# Patient Record
Sex: Male | Born: 1995 | Race: Black or African American | Hispanic: No | Marital: Single | Smoking: Never smoker
Health system: Southern US, Community
[De-identification: ages and names within clinical notes are randomized; demographics above are authoritative.]

---

## 2017-05-21 ENCOUNTER — Encounter (HOSPITAL_COMMUNITY): Payer: Self-pay | Admitting: Emergency Medicine

## 2017-05-21 ENCOUNTER — Other Ambulatory Visit: Payer: Self-pay

## 2017-05-21 ENCOUNTER — Ambulatory Visit (HOSPITAL_COMMUNITY)
Admission: EM | Admit: 2017-05-21 | Discharge: 2017-05-21 | Disposition: A | Discharge status: Home / Self Care | Payer: 59 | Attending: Internal Medicine | Admitting: Internal Medicine

## 2017-05-21 DIAGNOSIS — J0140 Acute pansinusitis, unspecified: Secondary | ICD-10-CM

## 2017-05-21 DIAGNOSIS — J309 Allergic rhinitis, unspecified: Secondary | ICD-10-CM

## 2017-05-21 MED ORDER — TRIAMCINOLONE ACETONIDE 55 MCG/ACT NA AERO: 2.0000 | INHALATION_SPRAY | Freq: Every day | NASAL | 12 refills | Status: DC

## 2017-05-21 MED ORDER — DOXYCYCLINE HYCLATE 100 MG PO CAPS: 100.0000 mg | ORAL_CAPSULE | Freq: Two times a day (BID) | ORAL | 0 refills | Status: DC

## 2017-05-21 MED ORDER — BENZONATATE 100 MG PO CAPS: 100.0000 mg | ORAL_CAPSULE | Freq: Three times a day (TID) | ORAL | 0 refills | Status: DC

## 2017-05-21 MED ORDER — CETIRIZINE HCL 10 MG PO TABS: 10.0000 mg | ORAL_TABLET | Freq: Every day | ORAL | 0 refills | Status: DC

## 2017-05-21 NOTE — ED Triage Notes (Signed)
Coughing for 2 days, patient also has sinus pressure.

## 2017-05-21 NOTE — Discharge Instructions (Signed)
Tessalon for cough. Start nasacort and zyrtec for sinus pressure/possible allergies. You can use over the counter nasal saline rinse such as neti pot for nasal congestion. Keep hydrated, your urine should be clear to pale yellow in color. Tylenol/motrin for fever and pain. Monitor for any worsening of symptoms, chest pain, shortness of breath, wheezing, swelling of the throat, follow up for reevaluation.   Can start doxycycline in 3 days if symptoms not improving

## 2017-05-21 NOTE — ED Provider Notes (Signed)
[facility]    CSN: [id number] Arrival date & time: 05/21/17  1714     History   Chief Complaint Chief Complaint  Patient presents with  . URI    HPI Roberto Rodriguez is a 22 y.o. male.   22 year old male comes in for 7-day history of URI symptoms.  Has had productive cough, sore throat, sinus pressure.  No obvious nasal congestion, rhinorrhea.  Take some allergy medicine, OTC cold medicine without relief.  States he initially thought this could be allergies, but given positive sick contact, came in for evaluation.  Never tobacco smoker.  Has used THC in the past.     History reviewed. No pertinent past medical history.  There are no active problems to display for this patient.   History reviewed. No pertinent surgical history.     Home Medications    Prior to Admission medications   Medication Sig Start Date End Date Taking? Authorizing Provider  benzonatate (TESSALON) 100 MG capsule Take 1 capsule (100 mg total) by mouth every 8 (eight) hours. 05/21/17   Cathie HoopsYu, Amy V, PA-C  cetirizine (ZYRTEC) 10 MG tablet Take 1 tablet (10 mg total) by mouth daily. 05/21/17   Cathie HoopsYu, Amy V, PA-C  doxycycline (VIBRAMYCIN) 100 MG capsule Take 1 capsule (100 mg total) by mouth 2 (two) times daily. 05/21/17   Cathie HoopsYu, Amy V, PA-C  triamcinolone (NASACORT) 55 MCG/ACT AERO nasal inhaler Place 2 sprays into the nose daily. 05/21/17   Belinda FisherYu, Amy V, PA-C    Family History Family History  Problem Relation Age of Onset  . Asthma Mother     Social History Social History   Tobacco Use  . Smoking status: Never Smoker  Substance Use Topics  . Alcohol use: Yes  . Drug use: Never     Allergies   Patient has no known allergies.   Review of Systems Review of Systems  Reason unable to perform ROS: See HPI as above.     Physical Exam Triage Vital Signs ED Triage Vitals  Enc Vitals Group     BP 05/21/17 1807 131/73     Pulse Rate 05/21/17 1807 74     Resp 05/21/17 1807 18     Temp  05/21/17 1807 99.2 F (37.3 C)     Temp Source 05/21/17 1807 Oral     SpO2 05/21/17 1807 99 %     Weight --      Height --      Head Circumference --      Peak Flow --      Pain Score 05/21/17 1805 5     Pain Loc --      Pain Edu? --      Excl. in [place]? --    No data found.  Updated Vital Signs BP 131/73 (BP Location: Left Arm)   Pulse 74   Temp 99.2 F (37.3 C) (Oral)   Resp 18   SpO2 99%   Physical Exam  Constitutional: He is oriented to person, place, and time. He appears well-developed and well-nourished. No distress.  HENT:  Head: Normocephalic and atraumatic.  Right Ear: External ear and ear canal normal.  Left Ear: External ear and ear canal normal. Tympanic membrane is erythematous. Tympanic membrane is not bulging.  Nose: Mucosal edema present. Right sinus exhibits no maxillary sinus tenderness and no frontal sinus tenderness. Left sinus exhibits no maxillary sinus tenderness and no frontal sinus tenderness.  Mouth/Throat: Uvula is midline, oropharynx is clear and  moist and mucous membranes are normal.  Right ear with cerumen impaction, TM not visible.  Eyes: Pupils are equal, round, and reactive to light. Conjunctivae are normal.  Neck: Normal range of motion. Neck supple.  Cardiovascular: Normal rate, regular rhythm and normal heart sounds. Exam reveals no gallop and no friction rub.  No murmur heard. Pulmonary/Chest: Effort normal and breath sounds normal. He has no decreased breath sounds. He has no wheezes. He has no rhonchi. He has no rales.  Lymphadenopathy:    He has no cervical adenopathy.  Neurological: He is alert and oriented to person, place, and time.  Skin: Skin is warm and dry. He is not diaphoretic.  Psychiatric: He has a normal mood and affect. His behavior is normal. Judgment normal.     UC Treatments / Results  Labs (all labs ordered are listed, but only abnormal results are displayed) Labs Reviewed - No data to  display  EKG None Radiology No results found.  Procedures Procedures (including critical care time)  Medications Ordered in UC Medications - No data to display   Initial Impression / Assessment and Plan / UC Course  I have reviewed the triage vital signs and the nursing notes.  Pertinent labs & imaging results that were available during my care of the patient were reviewed by me and considered in my medical decision making (see chart for details).    Discussed with patient history and exam most consistent with allergic rhinitis vs viral URI. Symptomatic treatment as needed. Rx of doxycycline sent to pharmacy, patient can fill to take if symptoms not improving in 3-5 days. Push fluids. Return precautions given.    Final Clinical Impressions(s) / UC Diagnoses   Final diagnoses:  Acute non-recurrent pansinusitis  Allergic rhinitis, unspecified seasonality, unspecified trigger    ED Discharge Orders        Ordered    cetirizine (ZYRTEC) 10 MG tablet  Daily     05/21/17 1825    triamcinolone (NASACORT) 55 MCG/ACT AERO nasal inhaler  Daily     05/21/17 1825    benzonatate (TESSALON) 100 MG capsule  Every 8 hours     05/21/17 1825    doxycycline (VIBRAMYCIN) 100 MG capsule  2 times daily     05/21/17 1825        Belinda Fisher, PA-C 05/21/17 1835

## 2018-09-24 ENCOUNTER — Encounter (HOSPITAL_COMMUNITY): Payer: Self-pay

## 2018-09-24 ENCOUNTER — Other Ambulatory Visit: Payer: Self-pay

## 2018-09-24 ENCOUNTER — Ambulatory Visit (INDEPENDENT_AMBULATORY_CARE_PROVIDER_SITE_OTHER): Payer: 59

## 2018-09-24 ENCOUNTER — Ambulatory Visit (HOSPITAL_COMMUNITY)
Admission: EM | Admit: 2018-09-24 | Discharge: 2018-09-24 | Disposition: A | Discharge status: Home / Self Care | Payer: 59 | Attending: Family Medicine | Admitting: Family Medicine

## 2018-09-24 DIAGNOSIS — Z20828 Contact with and (suspected) exposure to other viral communicable diseases: Secondary | ICD-10-CM | POA: Insufficient documentation

## 2018-09-24 DIAGNOSIS — R109 Unspecified abdominal pain: Secondary | ICD-10-CM | POA: Diagnosis not present

## 2018-09-24 LAB — COMPREHENSIVE METABOLIC PANEL
ALT: 19 U/L (ref 0–44)
AST: 20 U/L (ref 15–41)
Albumin: 4.7 g/dL (ref 3.5–5.0)
Alkaline Phosphatase: 43 U/L (ref 38–126)
Anion gap: 10 (ref 5–15)
BUN: <5 mg/dL — ABNORMAL LOW (ref 6–20)
CO2: 25 mmol/L (ref 22–32)
Calcium: 9.8 mg/dL (ref 8.9–10.3)
Chloride: 104 mmol/L (ref 98–111)
Creatinine, Ser: 0.85 mg/dL (ref 0.61–1.24)
GFR calc Af Amer: >60 mL/min (ref >60)
GFR calc non Af Amer: >60 mL/min (ref >60)
Glucose, Bld: 99 mg/dL (ref 70–99)
Potassium: 4.3 mmol/L (ref 3.5–5.1)
Sodium: 139 mmol/L (ref 135–145)
Total Bilirubin: 1.3 mg/dL — ABNORMAL HIGH (ref 0.3–1.2)
Total Protein: 7.3 g/dL (ref 6.5–8.1)

## 2018-09-24 LAB — CBC WITH DIFFERENTIAL/PLATELET
Abs Immature Granulocytes: 0.02 10*3/uL (ref 0.00–0.07)
Basophils Absolute: 0.1 10*3/uL (ref 0.0–0.1)
Basophils Relative: 1 %
Eosinophils Absolute: 0.2 10*3/uL (ref 0.0–0.5)
Eosinophils Relative: 3 %
HCT: 46.2 % (ref 39.0–52.0)
Hemoglobin: 16 g/dL (ref 13.0–17.0)
Immature Granulocytes: 0 %
Lymphocytes Relative: 21 %
Lymphs Abs: 1.5 10*3/uL (ref 0.7–4.0)
MCH: 31.4 pg (ref 26.0–34.0)
MCHC: 34.6 g/dL (ref 30.0–36.0)
MCV: 90.6 fL (ref 80.0–100.0)
Monocytes Absolute: 0.5 10*3/uL (ref 0.1–1.0)
Monocytes Relative: 7 %
Neutro Abs: 4.9 10*3/uL (ref 1.7–7.7)
Neutrophils Relative %: 68 %
Platelets: 213 10*3/uL (ref 150–400)
RBC: 5.1 MIL/uL (ref 4.22–5.81)
RDW: 12.8 % (ref 11.5–15.5)
WBC: 7.2 10*3/uL (ref 4.0–10.5)
nRBC: 0 % (ref 0.0–0.2)

## 2018-09-24 LAB — POCT URINALYSIS DIP (DEVICE)
Bilirubin Urine: NEGATIVE
Glucose, UA: NEGATIVE mg/dL
Hgb urine dipstick: NEGATIVE
Ketones, ur: NEGATIVE mg/dL
Leukocytes,Ua: NEGATIVE
Nitrite: NEGATIVE
Protein, ur: NEGATIVE mg/dL
Specific Gravity, Urine: 1.015 (ref 1.005–1.030)
Urobilinogen, UA: 0.2 mg/dL (ref 0.0–1.0)
pH: 7 (ref 5.0–8.0)

## 2018-09-24 LAB — LIPASE, BLOOD: Lipase: 20 U/L (ref 11–51)

## 2018-09-24 NOTE — ED Triage Notes (Signed)
Pt presents to UC with left flank pain since 2 days. Pt reports vomiting profusely 2 nights ago and chills. Pain came after the vomiting. Pt reports stool turning white 2 nights ago. Denies diarrhea.

## 2018-09-24 NOTE — ED Triage Notes (Signed)
Pt requests STD testing at this time.

## 2018-09-24 NOTE — Discharge Instructions (Signed)
Your urine did not show any infection or blood. Your x-ray was normal 1 of your blood test came back and this was also normal.  I am still awaiting the rest of your blood results. We have tested you for HIV, syphilis, gonorrhea, chlamydia and trichomonas. Also checking your liver and kidneys. I would recommend if your symptoms continue or worsen to include more severe pain, nausea, vomiting or any other concerning symptoms you need to go to the ER.

## 2018-09-25 LAB — RPR: RPR Ser Ql: NONREACTIVE

## 2018-09-25 LAB — HIV ANTIBODY (ROUTINE TESTING W REFLEX): HIV Screen 4th Generation wRfx: NONREACTIVE

## 2018-09-25 LAB — NOVEL CORONAVIRUS, NAA (HOSP ORDER, SEND-OUT TO REF LAB; TAT 18-24 HRS): SARS-CoV-2, NAA: NOT DETECTED

## 2018-09-27 ENCOUNTER — Encounter (HOSPITAL_COMMUNITY): Payer: Self-pay

## 2018-09-27 NOTE — ED Provider Notes (Signed)
[place]    CSN: [id number] Arrival date & time: 09/24/18  1339      History   Chief Complaint Chief Complaint  Patient presents with  . Flank Pain    HPI Roberto Rodriguez is a 23 y.o. male.   Patient is a 23 year old male who presents today with left flank pain x2 days and to left upper abdominal area..  Symptoms have been waxing and waning.  Reporting that he vomited profusely 2 nights ago and had chills.  That has since resolved.  Reporting a white stool approximate 2 days ago.  Since he has had regular bowel movements without hematochezia.  No diarrhea.  No fever.  He is also reporting prior to starting change in diet.  Patient is typically a vegetarian and ate some fried seafood.  Patient also admits to drinking heavily a few days a week.  No dysuria, hematuria or urinary frequency.  No penile discharge, penile swelling, testicle pain or swelling.  Patient requesting STD testing.  ROS per HPI      History reviewed. No pertinent past medical history.  There are no active problems to display for this patient.   History reviewed. No pertinent surgical history.     Home Medications    Prior to Admission medications   Medication Sig Start Date End Date Taking? Authorizing Provider  cetirizine (ZYRTEC) 10 MG tablet Take 1 tablet (10 mg total) by mouth daily. 05/21/17 09/24/18  Tasia Catchings, Amy V, PA-C  triamcinolone (NASACORT) 55 MCG/ACT AERO nasal inhaler Place 2 sprays into the nose daily. 05/21/17 09/24/18  Ok Edwards, PA-C    Family History Family History  Problem Relation Age of Onset  . Asthma Mother   . Healthy Father     Social History Social History   Tobacco Use  . Smoking status: Never Smoker  . Smokeless tobacco: Never Used  Substance Use Topics  . Alcohol use: Yes    Comment: 3-4 times a week  . Drug use: Yes    Types: Marijuana    Comment: 4 times a week     Allergies   Patient has no known allergies.   Review of Systems Review of  Systems   Physical Exam Triage Vital Signs ED Triage Vitals  Enc Vitals Group     BP 09/24/18 1404 (!) 151/78     Pulse Rate 09/24/18 1404 93     Resp 09/24/18 1404 16     Temp 09/24/18 1404 98.8 F (37.1 C)     Temp Source 09/24/18 1404 Oral     SpO2 09/24/18 1404 100 %     Weight --      Height --      Head Circumference --      Peak Flow --      Pain Score 09/24/18 1407 6     Pain Loc --      Pain Edu? --      Excl. in [place]? --    No data found.  Updated Vital Signs BP (!) 151/78   Pulse 93   Temp 98.8 F (37.1 C) (Oral)   Resp 16   SpO2 100%   Visual Acuity Right Eye Distance:   Left Eye Distance:   Bilateral Distance:    Right Eye Near:   Left Eye Near:    Bilateral Near:     Physical Exam Vitals signs and nursing note reviewed.  Constitutional:      General: He is not in acute  distress.    Appearance: Normal appearance. He is not ill-appearing, toxic-appearing or diaphoretic.  HENT:     Head: Normocephalic and atraumatic.     Nose: Nose normal.     Mouth/Throat:     Pharynx: Oropharynx is clear.  Eyes:     Conjunctiva/sclera: Conjunctivae normal.  Neck:     Musculoskeletal: Normal range of motion.  Abdominal:     General: There is no distension.     Palpations: Abdomen is soft. There is no mass.     Tenderness: There is no abdominal tenderness. There is no guarding or rebound.     Hernia: No hernia is present.     Comments: Mild tenderness to palpation of left flank and upper abdominal area.   Musculoskeletal: Normal range of motion.  Skin:    General: Skin is warm and dry.  Neurological:     Mental Status: He is alert.  Psychiatric:        Mood and Affect: Mood normal.      UC Treatments / Results  Labs (all labs ordered are listed, but only abnormal results are displayed) Labs Reviewed  COMPREHENSIVE METABOLIC PANEL - Abnormal; Notable for the following components:      Result Value   BUN <5 (*)    Total Bilirubin 1.3 (*)    All  other components within normal limits  NOVEL CORONAVIRUS, NAA (HOSP ORDER, SEND-OUT TO REF LAB; TAT 18-24 HRS)  CBC WITH DIFFERENTIAL/PLATELET  LIPASE, BLOOD  HIV ANTIBODY (ROUTINE TESTING W REFLEX)  RPR  POCT URINALYSIS DIP (DEVICE)  CYTOLOGY, (ORAL, ANAL, URETHRAL) ANCILLARY ONLY    EKG   Radiology No results found.  Procedures Procedures (including critical care time)  Medications Ordered in UC Medications - No data to display  Initial Impression / Assessment and Plan / UC Course  I have reviewed the triage vital signs and the nursing notes.  Pertinent labs & imaging results that were available during my care of the patient were reviewed by me and considered in my medical decision making (see chart for details).     23 year old male with approximately 2 days of waxing and waning left flank and upper abdominal pain. Urine without infection or hematuria. X-ray without any severe constipation or obstruction. All lab work completely normal Most likely symptoms are related to the change in diet or some sort of gastrointestinal virus. We will have him monitor his symptoms constant fluids and advance diet as tolerated. If symptoms worsen to include more severe abdominal pain, back pain or other concerning symptoms he went to go to the ER. Pt understanding and agreed.   STD labs still pending.    Final Clinical Impressions(s) / UC Diagnoses   Final diagnoses:  Flank pain     Discharge Instructions     Your urine did not show any infection or blood. Your x-ray was normal 1 of your blood test came back and this was also normal.  I am still awaiting the rest of your blood results. We have tested you for HIV, syphilis, gonorrhea, chlamydia and trichomonas. Also checking your liver and kidneys. I would recommend if your symptoms continue or worsen to include more severe pain, nausea, vomiting or any other concerning symptoms you need to go to the ER.    ED  Prescriptions    None     Controlled Substance Prescriptions [place] Controlled Substance Registry consulted? Not Applicable   Janace ArisBast, Doris Gruhn A, NP 09/27/18 [phone]

## 2018-09-29 LAB — CYTOLOGY, (ORAL, ANAL, URETHRAL) ANCILLARY ONLY
Chlamydia: NEGATIVE
Neisseria Gonorrhea: NEGATIVE
Trichomonas: NEGATIVE

## 2018-10-25 ENCOUNTER — Other Ambulatory Visit: Payer: Self-pay

## 2018-10-25 DIAGNOSIS — Z20822 Contact with and (suspected) exposure to covid-19: Secondary | ICD-10-CM

## 2018-10-26 LAB — NOVEL CORONAVIRUS, NAA: SARS-CoV-2, NAA: NOT DETECTED

## 2018-11-25 ENCOUNTER — Emergency Department (HOSPITAL_COMMUNITY): Payer: 59

## 2018-11-25 ENCOUNTER — Encounter (HOSPITAL_COMMUNITY): Payer: Self-pay | Admitting: Emergency Medicine

## 2018-11-25 ENCOUNTER — Other Ambulatory Visit: Payer: Self-pay

## 2018-11-25 ENCOUNTER — Emergency Department (HOSPITAL_COMMUNITY)
Admission: EM | Admit: 2018-11-25 | Discharge: 2018-11-25 | Disposition: A | Discharge status: Home / Self Care | Payer: 59 | Attending: Emergency Medicine | Admitting: Emergency Medicine

## 2018-11-25 DIAGNOSIS — Y92410 Unspecified street and highway as the place of occurrence of the external cause: Secondary | ICD-10-CM | POA: Insufficient documentation

## 2018-11-25 DIAGNOSIS — S5011XA Contusion of right forearm, initial encounter: Secondary | ICD-10-CM

## 2018-11-25 DIAGNOSIS — Y999 Unspecified external cause status: Secondary | ICD-10-CM | POA: Diagnosis not present

## 2018-11-25 DIAGNOSIS — Y93I9 Activity, other involving external motion: Secondary | ICD-10-CM | POA: Diagnosis not present

## 2018-11-25 DIAGNOSIS — S0990XA Unspecified injury of head, initial encounter: Secondary | ICD-10-CM | POA: Diagnosis not present

## 2018-11-25 NOTE — Discharge Instructions (Addendum)
Return here as needed. Your x-rays were normal.  You can expect to be more sore tomorrow and over the next 7 to 10 days.  Use Tylenol and Motrin for pain.  You can use ice and heat on the areas that are sore.
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
## 2018-11-25 NOTE — ED Notes (Signed)
Patient transported to CT 

## 2018-11-25 NOTE — ED Notes (Addendum)
Patient transported to XR. 

## 2019-04-14 ENCOUNTER — Ambulatory Visit: Payer: 59 | Attending: Family

## 2019-04-14 DIAGNOSIS — Z23 Encounter for immunization: Secondary | ICD-10-CM

## 2019-04-14 NOTE — Progress Notes (Signed)
   Covid-19 Vaccination Clinic  Name:  Iokepa Geffre    MRN: [id number] DOB: [birth year]  04/14/2019  Mr. Balaguer was observed post Covid-19 immunization for 15 minutes without incident. He was provided with Vaccine Information Sheet and instruction to access the V-Safe system.   Mr. Closs was instructed to call 911 with any severe reactions post vaccine: Marland Kitchen Difficulty breathing  . Swelling of face and throat  . A fast heartbeat  . A bad rash all over body  . Dizziness and weakness   Immunizations Administered    Name Date Dose VIS Date Route   Moderna COVID-19 Vaccine 04/14/2019  1:34 PM 0.5 mL 12/28/2018 Intramuscular   Manufacturer: Moderna   Lot: [id number]   NDC: 84720-721-82

## 2019-05-17 ENCOUNTER — Ambulatory Visit: Payer: 59

## 2019-05-24 ENCOUNTER — Ambulatory Visit: Payer: 59 | Attending: Family

## 2019-05-24 DIAGNOSIS — Z23 Encounter for immunization: Secondary | ICD-10-CM

## 2019-05-24 NOTE — Progress Notes (Signed)
   Covid-19 Vaccination Clinic  Name:  Roberto Rodriguez    MRN: [id number] DOB: 11/08/1995  05/24/2019  Roberto Rodriguez was observed post Covid-19 immunization for 15 minutes without incident. He was provided with Vaccine Information Sheet and instruction to access the V-Safe system.   Roberto Rodriguez was instructed to call 911 with any severe reactions post vaccine: Marland Kitchen Difficulty breathing  . Swelling of face and throat  . A fast heartbeat  . A bad rash all over body  . Dizziness and weakness   Immunizations Administered    Name Date Dose VIS Date Route   Moderna COVID-19 Vaccine 05/24/2019  2:02 PM 0.5 mL 12/2018 Intramuscular   Manufacturer: Moderna   Lot: [id number]   NDC: 08138-871-95
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
